# Patient Record
Sex: Male | Born: 1975 | Race: White | Hispanic: No | Marital: Married | Smoking: Former smoker
Health system: Southern US, Community
[De-identification: ages and names within clinical notes are randomized; demographics above are authoritative.]

## PROBLEM LIST (undated history)

## (undated) DIAGNOSIS — E7800 Pure hypercholesterolemia, unspecified: Secondary | ICD-10-CM

## (undated) DIAGNOSIS — R400 Somnolence: Secondary | ICD-10-CM

## (undated) DIAGNOSIS — F419 Anxiety disorder, unspecified: Secondary | ICD-10-CM

## (undated) HISTORY — PX: HERNIA REPAIR: SHX51

## (undated) HISTORY — PX: MYRINGOTOMY WITH TUBE PLACEMENT: SHX5663

## (undated) HISTORY — DX: Somnolence: R40.0

## (undated) HISTORY — DX: Pure hypercholesterolemia, unspecified: E78.00

---

## 2016-05-15 ENCOUNTER — Encounter: Payer: Self-pay | Admitting: Pulmonary Disease

## 2016-05-16 ENCOUNTER — Encounter: Payer: Self-pay | Admitting: Pulmonary Disease

## 2016-05-16 ENCOUNTER — Ambulatory Visit (INDEPENDENT_AMBULATORY_CARE_PROVIDER_SITE_OTHER): Payer: BLUE CROSS/BLUE SHIELD | Admitting: Pulmonary Disease

## 2016-05-16 VITALS — BP 104/72 | HR 75 | Ht 59.0 in | Wt 134.0 lb

## 2016-05-16 DIAGNOSIS — R0609 Other forms of dyspnea: Secondary | ICD-10-CM

## 2016-05-16 DIAGNOSIS — G4710 Hypersomnia, unspecified: Secondary | ICD-10-CM | POA: Diagnosis not present

## 2016-05-16 DIAGNOSIS — R0602 Shortness of breath: Secondary | ICD-10-CM

## 2016-05-16 NOTE — Progress Notes (Signed)
Subjective:    Patient ID: Daniel Cortez, male    DOB: 08/10/1975, 41 y.o.   MRN: [id number]  HPI   This is the case of Daniel Cortez, 41 y.o. Male, who was referred by Reinaldo Nodal  in consultation regarding possible OSA. .   As you very well know, patient has a 5PY smoking history, quit in his 44s, not known to have asthma or copd.   Patient has snoring, witnessed apneas, gasping, choking, awakenings at night, unrefreshed sleep. Sleeps 7 hrs when he has to work, and 12 hrs when off.  Very sleepy. Naps daily. (-) abnormal behavior in sleep.   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 He drives 12 miles 1 way to work. Gets sleepy driving.   He is SOB x 1 year.  Gets SOB with ADLs. (-) cp. He had "bronchitis" end of jan and feb.  He is finishing ZPAK. (-) orthopnea/swelling.   ESS 17.    Review of Systems  Constitutional: Negative.  Negative for fever and unexpected weight change.  HENT: Negative.  Negative for congestion, dental problem, ear pain, nosebleeds, postnasal drip, rhinorrhea, sinus pressure, sneezing, sore throat and trouble swallowing.   Eyes: Negative.  Negative for redness and itching.  Respiratory: Positive for cough, shortness of breath and wheezing. Negative for chest tightness.   Cardiovascular: Negative.  Negative for palpitations and leg swelling.  Gastrointestinal: Negative for nausea and vomiting.  Endocrine: Negative.   Genitourinary: Negative.  Negative for dysuria.  Musculoskeletal: Negative.  Negative for joint swelling.  Skin: Negative.  Negative for rash.  Allergic/Immunologic: Negative.  Negative for environmental allergies, food allergies and immunocompromised state.  Neurological: Positive for headaches.  Hematological: Negative.  Does not bruise/bleed easily.  Psychiatric/Behavioral: Negative.  Negative for dysphoric mood. The patient is not nervous/anxious.    Past Medical History:  Diagnosis Date  . Daytime sleepiness   .  Hypercholesteremia    (-) CA, DVT  Family History  Problem Relation Age of Onset  . Diabetes Father   . Seizures Father   . Colon cancer Maternal Grandmother      Past Surgical History:  Procedure Laterality Date  . HERNIA REPAIR    . MYRINGOTOMY WITH TUBE PLACEMENT      Social History   Social History  . Marital status: Married    Spouse name: N/A  . Number of children: N/A  . Years of education: N/A   Occupational History  . Not on file.   Social History Main Topics  . Smoking status: Former Smoker    Types: Cigarettes  . Smokeless tobacco: Current User  . Alcohol use Not on file  . Drug use: Unknown  . Sexual activity: Not on file   Other Topics Concern  . Not on file   Social History Narrative  . No narrative on file   Married with 1 child.  Lives in [place].   Allergies  Allergen Reactions  . Caffeine Anxiety    unknown     No outpatient prescriptions prior to visit.   No facility-administered medications prior to visit.    Meds ordered this encounter  Medications  . azithromycin (ZITHROMAX) 250 MG tablet    Sig: TAKE 2 TABLETS BY MOUTH X 1 TODAY, THEN 1 TABLET BY MOUTH FOR 4 DAYS        Objective:   Physical Exam  Vitals:  Vitals:   05/16/16 1400  BP: 104/72  Pulse: 75  SpO2: 98%  Weight: 134 lb (60.8 kg)  Height: 4\' 11"  (1.499 m)    Constitutional/General:  Pleasant, well-nourished, well-developed, not in any distress,  Comfortably seating.  Well kempt  Body mass index is 27.06 kg/m. Wt Readings from Last 3 Encounters:  05/16/16 134 lb (60.8 kg)     HEENT: Pupils equal and reactive to light and accommodation. Anicteric sclerae. Normal nasal mucosa.   No oral  lesions,  mouth clear,  oropharynx clear, no postnasal drip. (-) Oral thrush. No dental caries.  Airway - Mallampati class IV.  Crowded airway.   Neck: No masses. Midline trachea. No JVD, (-) LAD. (-) bruits appreciated.  Respiratory/Chest: Grossly normal chest.  (-) deformity. (-) Accessory muscle use.  Symmetric expansion. (-) Tenderness on palpation.  Resonant on percussion.  Diminished BS on both lower lung zones. (-) wheezing, crackles, rhonchi (-) egophony  Cardiovascular: Regular rate and  rhythm, heart sounds normal, no murmur or gallops, no peripheral edema  Gastrointestinal:  Normal bowel sounds. Soft, non-tender. No hepatosplenomegaly.  (-) masses.   Musculoskeletal:  Normal muscle tone. Normal gait.   Extremities: Grossly normal. (-) clubbing, cyanosis.  (-) edema  Skin: (-) rash,lesions seen.   Neurological/Psychiatric : alert, oriented to time, place, person. Normal mood and affect         Assessment & Plan:  Hypersomnia Patient has snoring, witnessed apneas, gasping, choking, awakenings at night, unrefreshed sleep. Sleeps 7 hrs when he has to work, and 12 hrs when off.  Very sleepy. Naps daily. (-) abnormal behavior in sleep.   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 He drives 12 miles 1 way to work. Gets sleepy driving.   He is SOB x 1 year.  Gets SOB with ADLs. (-) cp. He had "bronchitis" end of jan and feb.  He is finishing ZPAK. (-) orthopnea/swelling.   ESS 17.    Plan :  We discussed about the diagnosis of Obstructive Sleep Apnea (OSA) and implications of untreated OSA. We discussed about CPAP and BiPaP as possible treatment options.    We will schedule the patient for a sleep study. Plan for a split-night sleep study. Very symptomatic. He gets sleepy driving to work. He does shift work. He can be sleeping all day and wakes up very sleepy. His father-in-law has CPAP. Anticipate no issues with CPAP.   Patient was instructed to call the office if he/she has not heard back from the office 1-2 weeks after the sleep study.   Patient was instructed to call the office if he/she is having issues with the PAP device.   We discussed good sleep hygiene.   Patient was advised not to engage in activities  requiring concentration and/or vigilance if he/she is sleepy.  Patient was advised not to drive if he/she is sleepy.    Exertional dyspnea He is SOB x 1 year.  Gets SOB with ADLs. (-) cp. He had "bronchitis" end of jan and feb.  He is finishing ZPAK. (-) orthopnea/swelling.   Denies failure symptoms. No edema. 5 pack smoking history, quit in his 1520s.  Chest x-ray done in [place] on 05/10/2016: May be some hyperaeration. No infiltrates, no increased interstitial markings.  Not sure what etiology of exertional dyspnea is. Need to R/O COPD and CHF/CAD. Could be restrictive ventilatory defect. If PFT and echo are normal, plan to do a chest CT scan.      Thank you very much for letting me participate in this patient's care. Please do not  hesitate to give me a call if you have any questions or concerns regarding the treatment plan.   Patient will follow up with me in 8-10 weeks.     Pollie Meyer, MD 05/16/2016   2:43 PM Pulmonary and Critical Care Medicine [facility] Pager: [phone] Office: [phone], Fax: [phone]

## 2016-05-16 NOTE — Assessment & Plan Note (Addendum)
Patient has snoring, witnessed apneas, gasping, choking, awakenings at night, unrefreshed sleep. Sleeps 7 hrs when he has to work, and 12 hrs when off.  Very sleepy. Naps daily. (-) abnormal behavior in sleep.   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 He drives 12 miles 1 [address] to work. Gets sleepy driving.   He is SOB x 1 year.  Gets SOB with ADLs. (-) cp. He had "bronchitis" end of jan and feb.  He is finishing ZPAK. (-) orthopnea/swelling.   ESS 17.    Plan :  We discussed about the diagnosis of Obstructive Sleep Apnea (OSA) and implications of untreated OSA. We discussed about CPAP and BiPaP as possible treatment options.    We will schedule the patient for a sleep study. Plan for a split-night sleep study. Very symptomatic. He gets sleepy driving to work. He does shift work. He can be sleeping all day and wakes up very sleepy. His father-in-law has CPAP. Anticipate no issues with CPAP.   Patient was instructed to call the office if he/she has not heard back from the office 1-2 weeks after the sleep study.   Patient was instructed to call the office if he/she is having issues with the PAP device.   We discussed good sleep hygiene.   Patient was advised not to engage in activities requiring concentration and/or vigilance if he/she is sleepy.  Patient was advised not to drive if he/she is sleepy.

## 2016-05-16 NOTE — Assessment & Plan Note (Signed)
He is SOB x 1 year.  Gets SOB with ADLs. (-) cp. He had "bronchitis" end of jan and feb.  He is finishing ZPAK. (-) orthopnea/swelling.   Denies failure symptoms. No edema. 5 pack smoking history, quit in his 6720s.  Chest x-ray done in [place] on 05/10/2016: May be some hyperaeration. No infiltrates, no increased interstitial markings.  Not sure what etiology of exertional dyspnea is. Need to R/O COPD and CHF/CAD. Could be restrictive ventilatory defect. If PFT and echo are normal, plan to do a chest CT scan.

## 2016-05-16 NOTE — Patient Instructions (Signed)
It was a pleasure taking care of you today!  We will schedule you to have a sleep study to determine if you have sleep apnea.    We will get a lab sleep study.  You will be scheduled to have a lab sleep study in 4-6 weeks.  Someone from the sleep lab will call you in 2-3 days to schedule the study with you.  They usually have cancellations every night so most likely, they will have openings for a lab sleep study next week or so.  We encourage you to do your sleep study then if possible. Please give us a call in a week is no one from the sleep lab calls you in 2-3 days.   If the sleep study is positive, we will order you a CPAP  machine.  Please call the office if you do NOT receive your machine in the next 1-2 weeks.   Please make sure you use your CPAP device everytime you sleep.  We will monitor the usage of your machine per your insurance requirement.  Your insurance company may take the machine from you if you are not using it regularly.   Please clean the mask, tubings, filter, water reservoir with soapy water every week.  Please use distilled water for the water reservoir.   Please call the office or your machine provider (DME company) if you are having issues with the device.   We'll get a breathing test as well as an ultrasound of your heart.   Return to clinic in 8-10 weeks with Dr. Christene Slatese Dios or NP

## 2016-05-30 ENCOUNTER — Ambulatory Visit (HOSPITAL_COMMUNITY): Payer: BLUE CROSS/BLUE SHIELD | Attending: Cardiology

## 2016-05-30 ENCOUNTER — Other Ambulatory Visit: Payer: Self-pay

## 2016-05-30 DIAGNOSIS — R0602 Shortness of breath: Secondary | ICD-10-CM | POA: Diagnosis not present

## 2016-06-02 ENCOUNTER — Telehealth: Payer: Self-pay | Admitting: Pulmonary Disease

## 2016-06-02 NOTE — Telephone Encounter (Signed)
Spoke with the spouse and notified of results of ECHO  She verbalized understanding and nothing further needed

## 2016-06-02 NOTE — Progress Notes (Signed)
Spoke with the pt's spouse and notified of results

## 2016-06-27 ENCOUNTER — Ambulatory Visit (HOSPITAL_BASED_OUTPATIENT_CLINIC_OR_DEPARTMENT_OTHER): Payer: BLUE CROSS/BLUE SHIELD

## 2016-06-27 ENCOUNTER — Encounter (HOSPITAL_COMMUNITY): Payer: Self-pay | Admitting: Emergency Medicine

## 2016-06-27 ENCOUNTER — Emergency Department (HOSPITAL_COMMUNITY)
Admission: EM | Admit: 2016-06-27 | Discharge: 2016-06-27 | Disposition: A | Discharge status: Home / Self Care | Payer: BLUE CROSS/BLUE SHIELD | Attending: Emergency Medicine | Admitting: Emergency Medicine

## 2016-06-27 ENCOUNTER — Emergency Department (HOSPITAL_COMMUNITY): Payer: BLUE CROSS/BLUE SHIELD

## 2016-06-27 ENCOUNTER — Ambulatory Visit (INDEPENDENT_AMBULATORY_CARE_PROVIDER_SITE_OTHER): Payer: BLUE CROSS/BLUE SHIELD | Admitting: Pulmonary Disease

## 2016-06-27 ENCOUNTER — Telehealth: Payer: Self-pay | Admitting: Pulmonary Disease

## 2016-06-27 DIAGNOSIS — R072 Precordial pain: Secondary | ICD-10-CM | POA: Insufficient documentation

## 2016-06-27 DIAGNOSIS — R0602 Shortness of breath: Secondary | ICD-10-CM

## 2016-06-27 DIAGNOSIS — R0789 Other chest pain: Secondary | ICD-10-CM | POA: Diagnosis present

## 2016-06-27 DIAGNOSIS — Z87891 Personal history of nicotine dependence: Secondary | ICD-10-CM | POA: Diagnosis not present

## 2016-06-27 HISTORY — DX: Anxiety disorder, unspecified: F41.9

## 2016-06-27 LAB — CBC
HCT: 43.5 % (ref 39.0–52.0)
Hemoglobin: 15.7 g/dL (ref 13.0–17.0)
MCH: 32.1 pg (ref 26.0–34.0)
MCHC: 36.1 g/dL — ABNORMAL HIGH (ref 30.0–36.0)
MCV: 89 fL (ref 78.0–100.0)
PLATELETS: 253 10*3/uL (ref 150–400)
RBC: 4.89 MIL/uL (ref 4.22–5.81)
RDW: 13 % (ref 11.5–15.5)
WBC: 7.9 10*3/uL (ref 4.0–10.5)

## 2016-06-27 LAB — BASIC METABOLIC PANEL
ANION GAP: 7 (ref 5–15)
BUN: 18 mg/dL (ref 6–20)
CHLORIDE: 104 mmol/L (ref 101–111)
CO2: 27 mmol/L (ref 22–32)
CREATININE: 0.96 mg/dL (ref 0.61–1.24)
Calcium: 9.4 mg/dL (ref 8.9–10.3)
GFR calc non Af Amer: >60 mL/min (ref >60)
Glucose, Bld: 98 mg/dL (ref 65–99)
Potassium: 4 mmol/L (ref 3.5–5.1)
SODIUM: 138 mmol/L (ref 135–145)

## 2016-06-27 LAB — PULMONARY FUNCTION TEST
FEF 25-75 Pre: 1.28 L/sec
FEF2575-%Pred-Pre: 41 %
FEV1-%Pred-Pre: 69 %
FEV1-Pre: 2.07 L
FEV1FVC-%PRED-PRE: 80 %
FEV6-%PRED-PRE: 89 %
FEV6-Pre: 3.19 L
FEV6FVC-%PRED-PRE: 101 %
FVC-%PRED-PRE: 87 %
FVC-PRE: 3.21 L
PRE FEV1/FVC RATIO: 65 %
PRE FEV6/FVC RATIO: 99 %

## 2016-06-27 LAB — I-STAT TROPONIN, ED
TROPONIN I, POC: 0 ng/mL (ref 0.00–0.08)
TROPONIN I, POC: 0 ng/mL (ref 0.00–0.08)

## 2016-06-27 LAB — D-DIMER, QUANTITATIVE: D-Dimer, Quant: 0.38 ug/mL-FEU (ref 0.00–0.50)

## 2016-06-27 MED ORDER — IBUPROFEN 800 MG PO TABS: 800.0000 mg | ORAL_TABLET | Freq: Three times a day (TID) | ORAL | 0 refills | Status: AC | PRN

## 2016-06-27 MED ORDER — IBUPROFEN 100 MG/5ML PO SUSP
600.0000 mg | Freq: Once | ORAL | Status: AC
  Administered 2016-06-27: 600 mg via ORAL
  Filled 2016-06-27: qty 30

## 2016-06-27 MED ORDER — IBUPROFEN 800 MG PO TABS: 800.0000 mg | ORAL_TABLET | Freq: Once | ORAL | Status: DC

## 2016-06-27 NOTE — Discharge Instructions (Signed)

## 2016-06-27 NOTE — ED Provider Notes (Signed)
Emergency Department Provider Note   I have reviewed the triage vital signs and the nursing notes.   HISTORY  Chief Complaint Chest Pain   HPI Kwan Shellhammer is a 41 y.o. male with PMH of HLD, anxiety, and DM presents to the ED for evaluation of left sided chest and shoulder pain for the last 2 weeks. He reports some associated anterior chest pain. No modifying factors. No fever or chills. No similar symptoms in the past. Describes the pain as sharp. He was scheduled for a sleep study this evening what was delayed 2/2 chest pain. Patient was referred to the ED. Pain radiates down the left arm.   Past Medical History:  Diagnosis Date  . Anxiety   . Daytime sleepiness   . Hypercholesteremia     Patient Active Problem List   Diagnosis Date Noted  . Hypersomnia 05/16/2016  . Exertional dyspnea 05/16/2016    Past Surgical History:  Procedure Laterality Date  . HERNIA REPAIR    . MYRINGOTOMY WITH TUBE PLACEMENT      Current Outpatient Rx  . Order #: [id number] Class: Historical Med  . Order #: [id number] Class: Print    Allergies Caffeine  Family History  Problem Relation Age of Onset  . Diabetes Father   . Seizures Father   . Colon cancer Maternal Grandmother     Social History Social History  Substance Use Topics  . Smoking status: Former Smoker    Types: Cigarettes  . Smokeless tobacco: Current User  . Alcohol use Not on file    Review of Systems  Constitutional: No fever/chills Eyes: No visual changes. ENT: No sore throat. Cardiovascular: Positive chest pain. Respiratory: Denies shortness of breath. Gastrointestinal: No abdominal pain.  No nausea, no vomiting.  No diarrhea.  No constipation. Genitourinary: Negative for dysuria. Musculoskeletal: Negative for back pain. Positive left shoulder and left arm pain.  Skin: Negative for rash. Neurological: Negative for headaches, focal weakness or numbness.  10-point ROS otherwise  negative.  ____________________________________________   PHYSICAL EXAM:  VITAL SIGNS: ED Triage Vitals  Enc Vitals Group     BP 06/27/16 1652 128/80     Pulse Rate 06/27/16 1652 72     Resp 06/27/16 1652 18     Temp 06/27/16 1652 98.1 F (36.7 C)     Temp Source 06/27/16 1652 Oral     SpO2 06/27/16 1652 98 %     Pain Score 06/27/16 1657 7   Constitutional: Alert and oriented. Well appearing and in no acute distress. Eyes: Conjunctivae are normal.  Head: Atraumatic. Nose: No congestion/rhinnorhea. Mouth/Throat: Mucous membranes are moist.  Oropharynx non-erythematous. Neck: No stridor. Cardiovascular: Normal rate, regular rhythm. Good peripheral circulation. Grossly normal heart sounds.   Respiratory: Normal respiratory effort.  No retractions. Lungs CTAB. Gastrointestinal: Soft and nontender. No distention.  Musculoskeletal: No lower extremity tenderness nor edema. No gross deformities of extremities. Neurologic:  Normal speech and language. No gross focal neurologic deficits are appreciated.  Skin:  Skin is warm, dry and intact. No rash noted.   ____________________________________________   LABS (all labs ordered are listed, but only abnormal results are displayed)  Labs Reviewed  CBC - Abnormal; Notable for the following:       Result Value   MCHC 36.1 (*)    All other components within normal limits  BASIC METABOLIC PANEL  D-DIMER, QUANTITATIVE (NOT AT [facility])  I-STAT TROPOININ, ED  I-STAT TROPOININ, ED   ____________________________________________  EKG   EKG Interpretation  Date/Time:  Friday June 27 2016 16:56:07 EDT Ventricular Rate:  78 PR Interval:    QRS Duration: 100 QT Interval:  357 QTC Calculation: 407 R Axis:   62 Text Interpretation:  Sinus rhythm Short PR interval RSR' in V1 or V2, right VCD or RVH No STEMI.  Confirmed by Tashanti Dalporto MD, Kyrus Hyde [phone]) on 06/27/2016 8:56:29 PM Also confirmed by Santiaga Butzin MD, Letina Luckett [phone]), editor Misty Stanley [phone])  on 06/28/2016 9:32:10 AM       ____________________________________________  RADIOLOGY  Dg Chest 2 View  Result Date: 06/27/2016 CLINICAL DATA:  Left upper quadrant pain.  Posterior scapular pain. EXAM: CHEST  2 VIEW COMPARISON:  None. FINDINGS: The heart size and mediastinal contours are within normal limits. Both lungs are clear. The visualized skeletal structures are unremarkable. IMPRESSION: No active cardiopulmonary disease. Electronically Signed   By: Elige Ko   On: 06/27/2016 17:17    ____________________________________________   PROCEDURES  Procedure(s) performed:   Procedures  None ____________________________________________   INITIAL IMPRESSION / ASSESSMENT AND PLAN / ED COURSE  Pertinent labs & imaging results that were available during my care of the patient were reviewed by me and considered in my medical decision making (see chart for details).  Patient presents to the ED with atypical chest pain. Serial troponin negative. EKG unremarkable. D-dimer negative. No further imaging. Plan for discharge with PCP follow up.   At this time, I do not feel there is any life-threatening condition present. I have reviewed and discussed all results (EKG, imaging, lab, urine as appropriate), exam findings with patient. I have reviewed nursing notes and appropriate previous records.  I feel the patient is safe to be discharged home without further emergent workup. Discussed usual and customary return precautions. Patient and family (if present) verbalize understanding and are comfortable with this plan.  Patient will follow-up with their primary care provider. If they do not have a primary care provider, information for follow-up has been provided to them. All questions have been answered.  ____________________________________________  FINAL CLINICAL IMPRESSION(S) / ED DIAGNOSES  Final diagnoses:  Precordial chest pain     MEDICATIONS GIVEN DURING THIS  VISIT:  Medications  ibuprofen (ADVIL,MOTRIN) 100 MG/5ML suspension 600 mg (600 mg Oral Given 06/27/16 2127)     NEW OUTPATIENT MEDICATIONS STARTED DURING THIS VISIT:  New Prescriptions   IBUPROFEN (ADVIL,MOTRIN) 800 MG TABLET    Take 1 tablet (800 mg total) by mouth every 8 (eight) hours as needed.      Note:  This document was prepared using Dragon voice recognition software and may include unintentional dictation errors.  Alona Bene, MD Emergency Medicine   Maia Plan, MD 06/30/16 [phone]

## 2016-06-27 NOTE — ED Triage Notes (Signed)
Pt complaint of intermittent central/left chest pressure/stabbing since Wednesday. Denies associated symptoms.

## 2016-06-27 NOTE — Telephone Encounter (Addendum)
Per Jerolyn Shin, PFT tech, pt started having left sided chest pain that radiate to left arm during PFT. PFT was stopped. Pt states he rated his pain at a 7/10 and has been worsening since initial presentation on 06/25/16. Pt states he had a sleep study tonight and will need to this cancelled, this has been done. Pt states he wants to go to ED to be evaluated. Pt aware to call sleep lab back to get sleep study.   Will forward to AD as FYI.

## 2016-07-31 ENCOUNTER — Ambulatory Visit: Payer: BLUE CROSS/BLUE SHIELD | Admitting: Pulmonary Disease

## 2016-08-08 ENCOUNTER — Ambulatory Visit (HOSPITAL_BASED_OUTPATIENT_CLINIC_OR_DEPARTMENT_OTHER): Payer: BLUE CROSS/BLUE SHIELD | Attending: Pulmonary Disease | Admitting: Pulmonary Disease

## 2016-08-08 DIAGNOSIS — G4710 Hypersomnia, unspecified: Secondary | ICD-10-CM | POA: Insufficient documentation

## 2016-08-12 ENCOUNTER — Telehealth: Payer: Self-pay | Admitting: Pulmonary Disease

## 2016-08-12 ENCOUNTER — Other Ambulatory Visit (HOSPITAL_BASED_OUTPATIENT_CLINIC_OR_DEPARTMENT_OTHER): Payer: Self-pay

## 2016-08-12 DIAGNOSIS — G4710 Hypersomnia, unspecified: Secondary | ICD-10-CM

## 2016-08-12 NOTE — Procedures (Signed)
    NAME: Daniel GougeMichael Boehne DATE OF BIRTH:  01/03/1976 MEDICAL RECORD NUMBER [id number]  LOCATION: [facility]  PHYSICIAN: Daneen SchickJose Angelo A De Dios  DATE OF STUDY: 08/08/2016  CLINICAL INFORMATION  Sleep Study Type: NPSG  Indication for sleep study: Snoring  Epworth Sleepiness Score: 20   SLEEP STUDY TECHNIQUE  As per the AASM Manual for the Scoring of Sleep and Associated Events v2.3 (April 2016) with a hypopnea requiring 4% desaturations.  The channels recorded and monitored were frontal, central and occipital EEG, electrooculogram (EOG), submentalis EMG (chin), nasal and oral airflow, thoracic and abdominal wall motion, anterior tibialis EMG, snore microphone, electrocardiogram, and pulse oximetry.   MEDICATIONS  Medications self-administered by patient taken the night of the study : N/A   SLEEP ARCHITECTURE  The study was initiated at 9:51:35 PM and ended at 4:23:48 AM.  Sleep onset time was 2.9 minutes and the sleep efficiency was 84.3%. The total sleep time was 330.8 minutes.  Stage REM latency was 136.5 minutes.  The patient spent 3.11% of the night in stage N1 sleep, 72.70% in stage N2 sleep, 0.00% in stage N3 and 24.18% in REM.  Alpha intrusion was absent.  Supine sleep was 15.33%.   RESPIRATORY PARAMETERS  The overall apnea/hypopnea index (AHI) was 0.4 per hour. There were 2 total apneas, including 0 obstructive, 2 central and 0 mixed apneas. There were 0 hypopneas and 2 RERAs.  The AHI during Stage REM sleep was 1.5 per hour. AHI while supine was 0.0 per hour.  The mean oxygen saturation was 94.82%. The minimum SpO2 during sleep was 91.00%.  Moderate snoring was noted during this study.  CARDIAC DATA  The 2 lead EKG demonstrated sinus rhythm. The mean heart rate was 65.30 beats per minute. Other EKG findings include: None.  LEG MOVEMENT DATA  The total PLMS were 2 with a resulting PLMS index of 0.36. Associated arousal with leg movement index was 0.0  .  IMPRESSIONS  1. No significant obstructive sleep apnea occurred during this study (AHI = 0.4/h). 2. No significant central sleep apnea occurred during this study (CAI = 0.4/h). 3. Severe oxygen desaturation was noted during this study (Min O2 = 91.00%). 4. The patient snored with Moderate snoring volume. 5. No cardiac abnormalities were noted during this study. 6. Clinically significant periodic limb movements did not occur during sleep. No significant associated arousals.  DIAGNOSIS  No evidence for significant OSA based on this study.   RECOMMENDATIONS  1. No evidence for significant OSA based on this study. Clinical correlation regarding hypersomnia recommended. 2. Avoid alcohol, sedatives and other CNS depressants that may worsen sleep apnea and disrupt normal sleep architecture 3. Sleep hygiene should be reviewed to assess factors that may improve sleep quality. 4. Weight management and regular exercise should be initiated or continued if appropriate. 5. Follow up in the office as scheduled.  Pollie MeyerJ. Angelo A de Dios, MD 08/12/2016, 5:22 PM [place] [facility] Pager [phone] After 3 pm or if no answer, call [phone]

## 2016-08-12 NOTE — Telephone Encounter (Signed)
    Please call the pt and tell the pt the LAB SLEEP STUDY   did not show OSA.   Pt stops breathing < 1   times an hour.   We can discuss results on f/u. thanks   J. Alexis FrockAngelo A de Dios, MD 08/12/2016, 5:23 PM

## 2016-08-13 NOTE — Telephone Encounter (Signed)
[id number] x1 

## 2016-08-14 NOTE — Telephone Encounter (Signed)
Spoke with pt's wife (dpr on file), aware of results.  Verified ROV date/time.  Nothing further needed at this time.

## 2016-08-14 NOTE — Telephone Encounter (Signed)
Pt wife returning call a/b results she can be reached @ [phone].Caren GriffinsStanley A Dalton

## 2016-08-29 ENCOUNTER — Ambulatory Visit: Payer: BLUE CROSS/BLUE SHIELD | Admitting: Adult Health

## 2016-09-08 NOTE — Progress Notes (Signed)
Pt unable to complete test-sent to ER for CP.

## 2016-09-08 NOTE — Addendum Note (Signed)
Addended by: Ronny BaconWELCHEL, KATIE C on: 09/08/2016 02:53 PM   Modules accepted: Level of Service

## 2016-09-25 ENCOUNTER — Other Ambulatory Visit: Payer: Self-pay | Admitting: Internal Medicine

## 2016-09-25 DIAGNOSIS — R0600 Dyspnea, unspecified: Secondary | ICD-10-CM

## 2016-09-26 ENCOUNTER — Ambulatory Visit (INDEPENDENT_AMBULATORY_CARE_PROVIDER_SITE_OTHER): Payer: BLUE CROSS/BLUE SHIELD | Admitting: Adult Health

## 2016-09-26 ENCOUNTER — Ambulatory Visit (INDEPENDENT_AMBULATORY_CARE_PROVIDER_SITE_OTHER): Payer: BLUE CROSS/BLUE SHIELD | Admitting: Internal Medicine

## 2016-09-26 ENCOUNTER — Encounter: Payer: Self-pay | Admitting: Adult Health

## 2016-09-26 DIAGNOSIS — G4710 Hypersomnia, unspecified: Secondary | ICD-10-CM

## 2016-09-26 DIAGNOSIS — J4530 Mild persistent asthma, uncomplicated: Secondary | ICD-10-CM

## 2016-09-26 DIAGNOSIS — R0600 Dyspnea, unspecified: Secondary | ICD-10-CM | POA: Diagnosis not present

## 2016-09-26 LAB — PULMONARY FUNCTION TEST
DL/VA % pred: 107 %
DL/VA: 4.08 ml/min/mmHg/L
DLCO UNC % PRED: 115 %
DLCO UNC: 21 ml/min/mmHg
DLCO cor % pred: 110 %
DLCO cor: 20.19 ml/min/mmHg
FEF 25-75 PRE: 1.31 L/s
FEF 25-75 Post: 1.69 L/sec
FEF2575-%Change-Post: 29 %
FEF2575-%PRED-POST: 55 %
FEF2575-%Pred-Pre: 42 %
FEV1-%CHANGE-POST: 10 %
FEV1-%PRED-POST: 83 %
FEV1-%PRED-PRE: 75 %
FEV1-POST: 2.48 L
FEV1-PRE: 2.24 L
FEV1FVC-%Change-Post: 7 %
FEV1FVC-%PRED-PRE: 79 %
FEV6-%CHANGE-POST: 4 %
FEV6-%PRED-POST: 101 %
FEV6-%Pred-Pre: 97 %
FEV6-POST: 3.63 L
FEV6-PRE: 3.48 L
FEV6FVC-%CHANGE-POST: 1 %
FEV6FVC-%PRED-POST: 102 %
FEV6FVC-%PRED-PRE: 100 %
FVC-%CHANGE-POST: 2 %
FVC-%Pred-Post: 99 %
FVC-%Pred-Pre: 96 %
FVC-Post: 3.63 L
FVC-Pre: 3.54 L
POST FEV1/FVC RATIO: 68 %
Post FEV6/FVC ratio: 100 %
Pre FEV1/FVC ratio: 63 %
Pre FEV6/FVC Ratio: 98 %
RV % pred: 144 %
RV: 1.82 L
TLC % PRED: 111 %
TLC: 5.34 L

## 2016-09-26 LAB — NITRIC OXIDE: NITRIC OXIDE: 12

## 2016-09-26 NOTE — Progress Notes (Signed)
I have reviewed and agree with assessment/plan.  Dammon Makarewicz, MD [place] [facility] 09/26/2016, 3:18 PM Pager:  [phone]  

## 2016-09-26 NOTE — Progress Notes (Signed)
PFT done today. 

## 2016-09-26 NOTE — Progress Notes (Signed)
 @Patient  ID: Daniel Cortez, male    DOB: May 20, 1975, 41 y.o.   MRN: [id number]  Chief Complaint  Patient presents with  . Follow-up    asthma     Referring provider: [facility] [place] [facility]  HPI: 41 year old male with minimal smoking history presented for consult in March 2018 for possible sleep apnea and shortness of breath.  TEST  NSPG 07/2016 neg for OSA   09/26/2016 Follow up : Datytime sleepiness and Cough /dyspnea  Patient returns for a four-month follow-up. Patient was seen in March of this year for a consult for possible sleep apnea and dyspnea with cough. Patient was set up for a sleep study that was done on May 25,2018 did not show any evidence of sleep apnea with AHI at 0.4. Patient been having daytime sleepiness and snoring.  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 He also works a swing shift from 3 AM to 1 PM, 5-6 days a week. Since then he has been sleepy. Has troubles with daytime sleepiness.  He is hopeful that he may have a new job, but has not gotten final confirmation yet. Patient was set up for pulmonary function test that was done today. That showed mild airflow obstruction with an FEV1 at 83%, ratio 68, FVC 99%, 10% bronchodilator change. Positive mid flow reversibility. DLCO 115%.  Chest x-ray in April showed clear lungs.. Exhaled nitric oxide testing today was nml at 12 .  He denies any hemoptysis, chest pain, orthopnea, PND, or increased leg swelling     Allergies  Allergen Reactions  . Caffeine Anxiety    unknown    Immunization History  Administered Date(s) Administered  . Influenza,inj,Quad PF,36+ Mos 02/20/2016    Past Medical History:  Diagnosis Date  . Anxiety   . Daytime sleepiness   . Hypercholesteremia     Tobacco History: History  Smoking Status  . Former Smoker    . Types: Cigarettes  . Quit date: 03/17/1996  Smokeless Tobacco  . Current User   Ready to quit: Not Answered Counseling given: Not Answered   Outpatient Encounter Prescriptions as of 09/26/2016  Medication Sig  . acetaminophen (TYLENOL CHILDRENS) 160 MG/5ML suspension Take 160 mg by mouth every 6 (six) hours as needed for moderate pain.  Marland Kitchen. albuterol (PROVENTIL) (2.5 MG/3ML) 0.083% nebulizer solution Take 2.5 mg by nebulization every 6 (six) hours as needed for wheezing or shortness of breath.  Marland Kitchen. ibuprofen (ADVIL,MOTRIN) 800 MG tablet Take 1 tablet (800 mg total) by mouth every 8 (eight) hours as needed.   No facility-administered encounter medications on file as of 09/26/2016.      Review of Systems  Constitutional:   No  weight loss, night sweats,  Fevers, chills, fatigue, or  lassitude.  HEENT:   No headaches,  Difficulty swallowing,  Tooth/dental problems, or  Sore throat,                No sneezing, itching, ear ache, +nasal congestion, post nasal drip,   CV:  No chest pain,  Orthopnea, PND, swelling in lower extremities, anasarca, dizziness, palpitations, syncope.   GI  No heartburn, indigestion, abdominal pain, nausea, vomiting, diarrhea, change in bowel habits, loss of appetite, bloody stools.   Resp:   No chest wall deformity  Skin: no rash or lesions.  GU: no dysuria, change in  color of urine, no urgency or frequency.  No flank pain, no hematuria   MS:  No joint pain or swelling.  No decreased range of motion.  No back pain.    Physical Exam  BP 112/64 (BP Location: Right Arm, Cuff Size: Normal)   Pulse 66   Ht 4' 11.5" (1.511 m)   Wt 136 lb (61.7 kg)   SpO2 98%   BMI 27.01 kg/m   GEN: A/Ox3; pleasant , NAD ,    HEENT:  [place]/AT,   l, NOSE-clear, THROAT-clear, no lesions, no postnasal drip or exudate noted.  +hearing aids bilaterally   NECK:  Supple w/ fair ROM; no JVD; normal carotid impulses w/o bruits; no thyromegaly or nodules palpated; no  lymphadenopathy.    RESP  Clear  P & A; w/o, wheezes/ rales/ or rhonchi. no accessory muscle use, no dullness to percussion  CARD:  RRR, no m/r/g, no peripheral edema, pulses intact, no cyanosis or clubbing.  GI:   Soft & nt; nml bowel sounds; no organomegaly or masses detected.   Musco: Warm bil, no deformities or joint swelling noted.   Neuro: alert, no focal deficits noted.    Skin: Warm, no lesions or rashes     Lab Results:  CBC    Component Value Date/Time   WBC 7.9 06/27/2016 1703   RBC 4.89 06/27/2016 1703   HGB 15.7 06/27/2016 1703   HCT 43.5 06/27/2016 1703   PLT 253 06/27/2016 1703   MCV 89.0 06/27/2016 1703   MCH 32.1 06/27/2016 1703   MCHC 36.1 (H) 06/27/2016 1703   RDW 13.0 06/27/2016 1703    BMET    Component Value Date/Time   NA 138 06/27/2016 1703   K 4.0 06/27/2016 1703   CL 104 06/27/2016 1703   CO2 27 06/27/2016 1703   GLUCOSE 98 06/27/2016 1703   BUN 18 06/27/2016 1703   CREATININE 0.96 06/27/2016 1703   CALCIUM 9.4 06/27/2016 1703   GFRNONAA >60 06/27/2016 1703   GFRAA >60 06/27/2016 1703    BNP No results found for: BNP  ProBNP No results found for: PROBNP  Imaging: No results found.   Assessment & Plan:   Asthma Suspect this is asthma . Foundry dust may be a trigger . CXR is clear at this point  Would be better if he decreased trigger exposure .   Plan  BREO trial  Patient Instructions  Begin BREO 1 puff daily, rinse after use.  Healthy sleep regimen  Follow up with Dr. Craige Cotta  In 3 months and As needed        Please contact office for sooner follow up if symptoms do not improve or worsen or seek emergency care    Hypersomnia Suspect may be related to his work schedule with long shift and 50-60 hrs each week and swing shift like [employer] (3a to 1pm )  Sleep study did not support sleep apnea.  If persists after he changes shift may need to do additional testing  Says he did not have this problem until he started  this shift.      Rubye Oaks, NP 09/26/2016

## 2016-09-26 NOTE — Assessment & Plan Note (Signed)
Suspect this is asthma . Foundry dust may be a trigger . CXR is clear at this point  Would be better if he decreased trigger exposure .   Plan  BREO trial  Patient Instructions  Begin BREO 1 puff daily, rinse after use.  Healthy sleep regimen  Follow up with Dr. Craige CottaSood  In 3 months and As needed        Please contact office for sooner follow up if symptoms do not improve or worsen or seek emergency care

## 2016-09-26 NOTE — Assessment & Plan Note (Addendum)
Suspect may be related to his work schedule with long shift and 50-60 hrs each week and swing shift like [employer] (3a to 1pm )  Sleep study did not support sleep apnea.  If persists after he changes shift may need to do additional testing  Says he did not have this problem until he started this shift.

## 2016-09-26 NOTE — Patient Instructions (Addendum)
Begin BREO 1 puff daily, rinse after use.  Healthy sleep regimen  Follow up with Dr. Craige CottaSood  In 3 months and As needed

## 2016-09-29 ENCOUNTER — Encounter: Payer: Self-pay | Admitting: Internal Medicine

## 2016-09-29 ENCOUNTER — Telehealth: Payer: Self-pay | Admitting: Adult Health

## 2016-09-29 NOTE — Telephone Encounter (Signed)
Spoke with Energy East CorporationMisty. Advised that per his chart, no one had called the patient to discuss results. I did not see where any imaging or additional lab work had been done. She stated that she will personally listen to the message when she gets home.  She verbalized understanding. Nothing else was needed at time of call.

## 2016-09-29 NOTE — Progress Notes (Signed)
LMTCB

## 2016-10-01 MED ORDER — FLUTICASONE FUROATE-VILANTEROL 100-25 MCG/INH IN AEPB: 1.0000 | INHALATION_SPRAY | Freq: Every day | RESPIRATORY_TRACT | 0 refills | Status: AC

## 2016-10-01 MED ORDER — FLUTICASONE FUROATE-VILANTEROL 100-25 MCG/INH IN AEPB: 1.0000 | INHALATION_SPRAY | Freq: Every day | RESPIRATORY_TRACT | 5 refills | Status: AC

## 2016-10-01 NOTE — Progress Notes (Signed)
ATC, Call will not go through

## 2016-10-01 NOTE — Addendum Note (Signed)
Addended by: Boone MasterJONES, Shivank Pinedo E on: 10/01/2016 12:20 PM   Modules accepted: Orders

## 2016-10-03 NOTE — Progress Notes (Signed)
LMTCB

## 2016-10-06 NOTE — Progress Notes (Signed)
Spoke with pt and notified of results per Dr. Wert. Pt verbalized understanding and denied any questions. 

## 2017-01-01 ENCOUNTER — Telehealth: Payer: Self-pay | Admitting: Pulmonary Disease

## 2017-01-01 NOTE — Telephone Encounter (Signed)
I called patient's wife, Lanice SchwabMisty, to advise of change in VS schedule and to reschedule 10/19 appt (I had left him 3 voicemails this week with no response). Appointment was rescheduled for Friday, 02/20/17 at 4:15pm (patient needed a Friday afternoon due to work).  May close message.

## 2017-01-01 NOTE — Telephone Encounter (Signed)
Left voice mail on machine for patient to return phone call back regarding his appt tomorrow, seeing if he would like to come in earlier that morning instead of the scheduled time of 3:45pm.Preferrable mid afternoon or morning appt.  Will follow up again with patient at later date.

## 2017-01-02 ENCOUNTER — Ambulatory Visit: Payer: BLUE CROSS/BLUE SHIELD | Admitting: Pulmonary Disease

## 2017-02-20 ENCOUNTER — Ambulatory Visit: Payer: Self-pay | Admitting: Pulmonary Disease

## 2017-06-04 ENCOUNTER — Encounter (HOSPITAL_COMMUNITY): Payer: Self-pay

## 2017-06-04 ENCOUNTER — Emergency Department (HOSPITAL_COMMUNITY): Payer: Self-pay

## 2017-06-04 ENCOUNTER — Emergency Department (HOSPITAL_COMMUNITY)
Admission: EM | Admit: 2017-06-04 | Discharge: 2017-06-04 | Disposition: A | Discharge status: Home / Self Care | Payer: Self-pay | Attending: Emergency Medicine | Admitting: Emergency Medicine

## 2017-06-04 ENCOUNTER — Other Ambulatory Visit: Payer: Self-pay

## 2017-06-04 DIAGNOSIS — H00033 Abscess of eyelid right eye, unspecified eyelid: Secondary | ICD-10-CM | POA: Insufficient documentation

## 2017-06-04 DIAGNOSIS — J45909 Unspecified asthma, uncomplicated: Secondary | ICD-10-CM | POA: Insufficient documentation

## 2017-06-04 DIAGNOSIS — Z79899 Other long term (current) drug therapy: Secondary | ICD-10-CM | POA: Insufficient documentation

## 2017-06-04 DIAGNOSIS — Z87891 Personal history of nicotine dependence: Secondary | ICD-10-CM | POA: Insufficient documentation

## 2017-06-04 LAB — BASIC METABOLIC PANEL
Anion gap: 9 (ref 5–15)
BUN: 7 mg/dL (ref 6–20)
CHLORIDE: 103 mmol/L (ref 101–111)
CO2: 26 mmol/L (ref 22–32)
Calcium: 9.5 mg/dL (ref 8.9–10.3)
Creatinine, Ser: 0.92 mg/dL (ref 0.61–1.24)
GFR calc non Af Amer: >60 mL/min (ref >60)
Glucose, Bld: 89 mg/dL (ref 65–99)
POTASSIUM: 3.6 mmol/L (ref 3.5–5.1)
SODIUM: 138 mmol/L (ref 135–145)

## 2017-06-04 LAB — CBC WITH DIFFERENTIAL/PLATELET
BASOS ABS: 0 10*3/uL (ref 0.0–0.1)
Basophils Relative: 0 %
EOS PCT: 3 %
Eosinophils Absolute: 0.2 10*3/uL (ref 0.0–0.7)
HCT: 45.7 % (ref 39.0–52.0)
Hemoglobin: 15.9 g/dL (ref 13.0–17.0)
LYMPHS PCT: 43 %
Lymphs Abs: 3.3 10*3/uL (ref 0.7–4.0)
MCH: 30.9 pg (ref 26.0–34.0)
MCHC: 34.8 g/dL (ref 30.0–36.0)
MCV: 88.9 fL (ref 78.0–100.0)
MONO ABS: 0.6 10*3/uL (ref 0.1–1.0)
Monocytes Relative: 8 %
Neutro Abs: 3.6 10*3/uL (ref 1.7–7.7)
Neutrophils Relative %: 46 %
Platelets: 237 10*3/uL (ref 150–400)
RBC: 5.14 MIL/uL (ref 4.22–5.81)
RDW: 13.1 % (ref 11.5–15.5)
WBC: 7.6 10*3/uL (ref 4.0–10.5)

## 2017-06-04 MED ORDER — ACETAMINOPHEN 325 MG PO TABS: 650.0000 mg | ORAL_TABLET | Freq: Once | ORAL | Status: DC

## 2017-06-04 MED ORDER — IOPAMIDOL (ISOVUE-300) INJECTION 61%
75.0000 mL | Freq: Once | INTRAVENOUS | Status: AC | PRN
  Administered 2017-06-04: 75 mL via INTRAVENOUS

## 2017-06-04 NOTE — ED Triage Notes (Signed)
Pt presents to the ed with complaints of an abscess on his eye, has been on antibiotics x 2 days. Pt states that it is worse since then. He was sent here by an eye doctor. Denies vision loss.

## 2017-06-04 NOTE — ED Provider Notes (Signed)
[facility] EMERGENCY DEPARTMENT Provider Note   CSN: [id number] Arrival date & time: 06/04/17  1442     History   Chief Complaint Chief Complaint  Patient presents with  . Abscess    HPI Daniel Cortez is a 42 y.o. male.  The history is provided by the patient and the spouse.  Abscess  Location:  Head/neck Head/neck abscess location: R lower eyelid. Abscess quality: draining, painful, redness and warmth   Duration:  3 days Progression:  Worsening Pain details:    Quality:  Dull   Severity:  Moderate   Duration:  3 days   Timing:  Constant   Progression:  Worsening Chronicity:  New Context: not diabetes, not immunosuppression and not skin injury   Relieved by:  Nothing Worsened by:  Nothing Ineffective treatments:  Warm compresses and oral antibiotics Associated symptoms: no fever, no headaches, no nausea and no vomiting   -c/o pain looking to right -Told by his eye doctor to come to ED for evaluation and CT scan  Past Medical History:  Diagnosis Date  . Anxiety   . Daytime sleepiness   . Hypercholesteremia     Patient Active Problem List   Diagnosis Date Noted  . Mild persistent asthma without complication 09/26/2016  . Hypersomnia 05/16/2016  . Exertional dyspnea 05/16/2016    Past Surgical History:  Procedure Laterality Date  . HERNIA REPAIR    . MYRINGOTOMY WITH TUBE PLACEMENT         Home Medications    Prior to Admission medications   Medication Sig Start Date End Date Taking? Authorizing Provider  acetaminophen (TYLENOL CHILDRENS) 160 MG/5ML suspension Take 160 mg by mouth every 6 (six) hours as needed for moderate pain.    [provider]  albuterol (PROVENTIL) (2.5 MG/3ML) 0.083% nebulizer solution Take 2.5 mg by nebulization every 6 (six) hours as needed for wheezing or shortness of breath.    [provider]  fluticasone furoate-vilanterol (BREO ELLIPTA) 100-25 MCG/INH AEPB Inhale 1 puff into the  lungs daily. 10/01/16   Parrett, Tammy S, NP  fluticasone furoate-vilanterol (BREO ELLIPTA) 100-25 MCG/INH AEPB Inhale 1 puff into the lungs daily. 10/01/16   Parrett, Virgel Bouquet, NP  ibuprofen (ADVIL,MOTRIN) 800 MG tablet Take 1 tablet (800 mg total) by mouth every 8 (eight) hours as needed. 06/27/16   Long, Arlyss Repress, MD    Family History Family History  Problem Relation Age of Onset  . Diabetes Father   . Seizures Father   . Colon cancer Maternal Grandmother     Social History Social History   Tobacco Use  . Smoking status: Former Smoker    Types: Cigarettes    Last attempt to quit: 03/17/1996    Years since quitting: 21.2  . Smokeless tobacco: Current User  Substance Use Topics  . Alcohol use: Not on file  . Drug use: Not on file     Allergies   Caffeine   Review of Systems Review of Systems  Constitutional: Negative for chills and fever.  HENT: Negative for ear pain and sore throat.   Eyes: Positive for photophobia. Negative for pain, discharge and visual disturbance.  Respiratory: Negative for cough and shortness of breath.   Cardiovascular: Negative for chest pain.  Gastrointestinal: Negative for abdominal pain, nausea and vomiting.  Musculoskeletal: Negative for arthralgias and back pain.  Skin: Negative for color change and rash.  Neurological: Negative for headaches.  All other systems reviewed and are negative.  Physical Exam Updated Vital Signs BP 117/86   Pulse 72   Temp 98.9 F (37.2 C) (Oral)   Resp 16   Wt 61.7 kg (136 lb)   SpO2 100%   BMI 27.01 kg/m   Physical Exam  Constitutional: He appears well-developed and well-nourished.  HENT:  Head: Normocephalic and atraumatic.  Eyes: Pupils are equal, round, and reactive to light. EOM are normal. Right conjunctiva is injected.    No consensual photophobia  Neck: Neck supple.  Cardiovascular: Normal rate and regular rhythm.  No murmur heard. Pulmonary/Chest: Effort normal and breath sounds  normal. No respiratory distress. He has no wheezes. He has no rales.  Abdominal: Soft. There is no tenderness.  Musculoskeletal: He exhibits no edema.  Neurological: He is alert.  Skin: Skin is warm and dry.  Psychiatric: He has a normal mood and affect.  Nursing note and vitals reviewed.    ED Treatments / Results  Labs (all labs ordered are listed, but only abnormal results are displayed) Labs Reviewed  CBC WITH DIFFERENTIAL/PLATELET  BASIC METABOLIC PANEL    EKG  EKG Interpretation None       Radiology Ct Maxillofacial W Contrast  Result Date: 06/04/2017 CLINICAL DATA:  Right eye swelling with pain. EXAM: CT MAXILLOFACIAL WITH CONTRAST TECHNIQUE: Multidetector CT imaging of the maxillofacial structures was performed with intravenous contrast. Multiplanar CT image reconstructions were also generated. CONTRAST:  75mL ISOVUE-300 IOPAMIDOL (ISOVUE-300) INJECTION 61% COMPARISON:  None. FINDINGS: Osseous: --Complex facial fracture types: No LeFort, zygomaticomaxillary complex or nasoorbitoethmoidal fracture. --Simple fracture types: None. --Mandible, hard palate and teeth: No acute abnormality. Orbits: The globes appear intact. Normal appearance of the intra- and extraconal fat. Symmetric extraocular muscles. Sinuses: No fluid levels or advanced mucosal thickening. Soft tissues: Normal visualized extracranial soft tissues. Limited intracranial: Normal. IMPRESSION: Normal facial CT without evidence of orbital or periorbital cellulitis. Electronically Signed   By: Deatra Robinson M.D.   On: 06/04/2017 18:18    Procedures Procedures (including critical care time)  Medications Ordered in ED Medications  acetaminophen (TYLENOL) tablet 650 mg (650 mg Oral Not Given 06/04/17 2319)  iopamidol (ISOVUE-300) 61 % injection 75 mL (75 mLs Intravenous Contrast Given 06/04/17 1750)     Initial Impression / Assessment and Plan / ED Course  I have reviewed the triage vital signs and the nursing  notes.  Pertinent labs & imaging results that were available during my care of the patient were reviewed by me and considered in my medical decision making (see chart for details).     Patient is a 42 year old male with history of anxiety, hyperlipidemia who presents for evaluation of a eyelid abscess.  Patient is being treated with Bactrim and Keflex outpatient since Tuesday.  Patient was told to come to the emergency department since he is having worsening redness and pain.  His ophthalmologist wanted him to receive a CT scan to rule out orbital cellulitis.  Patient seen through first look and the CT is ordered.  He had a normal CBC and BMP without any leukocytosis.  CT did not show any signs of orbital or periorbital cellulitis.  Patient has what appears to be a stye or small left lower lid abscess which is currently draining spontaneously.  Pus is noted to drain with gentle pressure.  He has normal extraocular movements.  No consensual photophobia to suggest iritis.  He has mild conjunctival injection on the right but otherwise no acute findings on exam.  I tried calling his ophthalmologist personal  number that was provided to me by the patient, however she did not answer so I left a voicemail for her to call me back.  Patient has a follow-up appointment tomorrow morning at 9 AM with his ophthalmologist for incision and drainage.  I told the patient to continue taking his antibiotics and to continue warm compresses that seems to be helping at drain.  Patient is discharged in good condition.  Final Clinical Impressions(s) / ED Diagnoses   Final diagnoses:  Eyelid abscess, right    ED Discharge Orders    None       Dwana MelenaDong, Neizan Debruhl, DO 06/05/17 0001    Long, Arlyss RepressJoshua G, MD 06/05/17 [phone]

## 2017-06-04 NOTE — ED Provider Notes (Signed)
Patient placed in Quick Look pathway, seen and evaluated   Chief Complaint: periorbital abscess, pain with EOM, photophobia  HPI:   Patient with no pertinent past medical history presenting with right periorbital abscess.  He is being followed by ophthalmology who has prescribed keflex and bactrim 2 days ago. Patient reports worsening and was sent to the ED for Eval. Denies visual changes. Reports relief from tylenol.  ROS: reports associated right sided head ache and photophobia. Denies fever, chills.  Physical Exam:   Gen: No distress  Neuro: Awake and Alert  Skin: Warm    Focused Exam: inflammation of right lower lid with erythema and purulent center, pupils are round and reactive, full EOM with reported discomfort deep in orbit.  Initiation of care has begun. The patient has been counseled on the process, plan, and necessity for staying for the completion/evaluation, and the remainder of the medical screening examination    Gregary CromerMitchell, Jolyn Deshmukh B, PA-C 06/04/17 1547    Gerhard MunchLockwood, Robert, MD 06/04/17 [phone]

## 2018-06-26 IMAGING — CT CT MAXILLOFACIAL W/ CM
3 of 6 series · 15 of 47 positions shown, 18 images · IV contrast (APPLIED)
Comparison: None.

CLINICAL DATA: Right eye swelling with pain.

EXAM:
CT MAXILLOFACIAL WITH CONTRAST
TECHNIQUE: Multidetector CT imaging of the maxillofacial structures was
performed with intravenous contrast. Multiplanar CT image
reconstructions were also generated.
CONTRAST:  75mL UKSHIS-266 IOPAMIDOL (UKSHIS-266) INJECTION 61%

[Series 3: maxilllofacial 2.0 hr40 3 · axial · 0.34mm/px · z∈[+1232,+1368]mm · 10 of 80 slices shown, 13 images]
[im 6/80  brain]
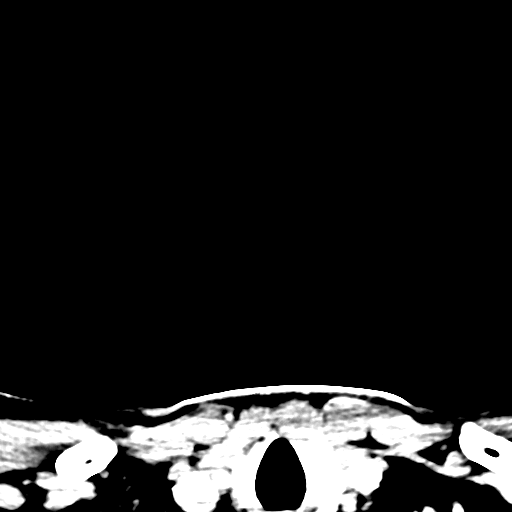
[im 6/80  bone]
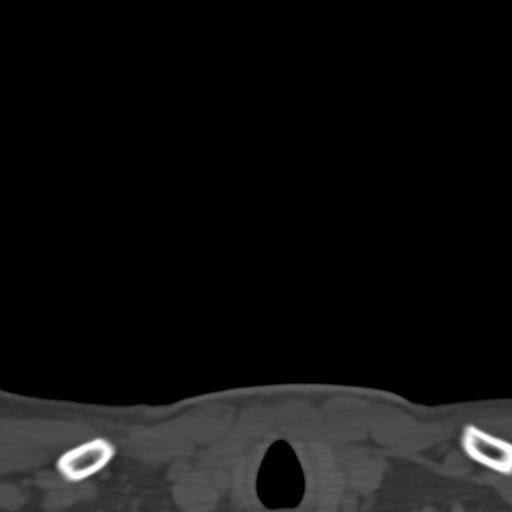
[im 12/80  bone]
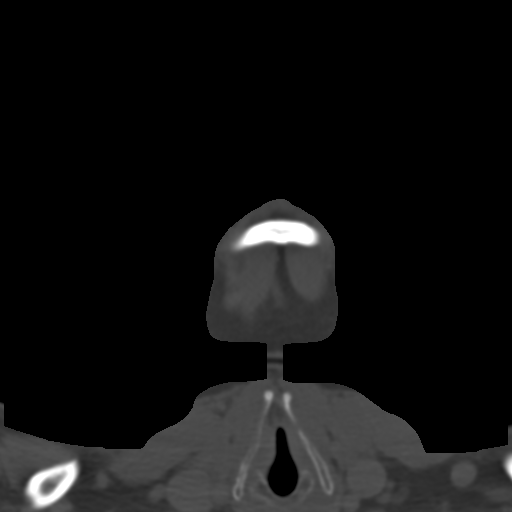
[im 23/80  bone]
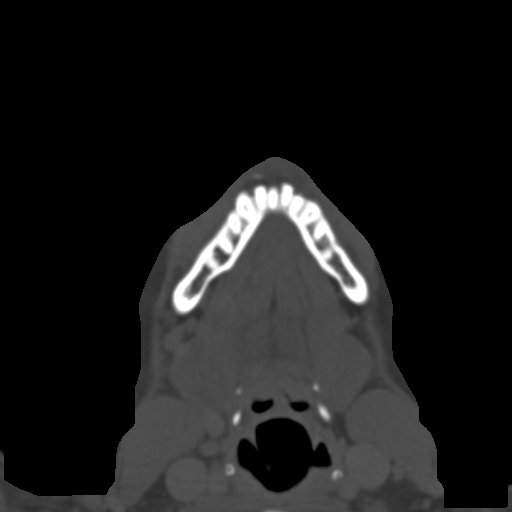
[im 29/80  bone]
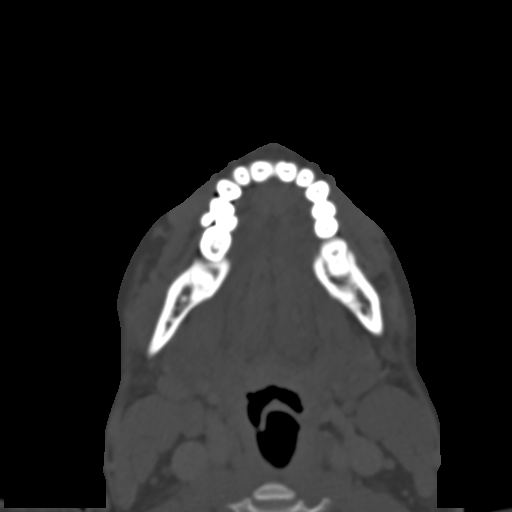
[im 34/80  brain]
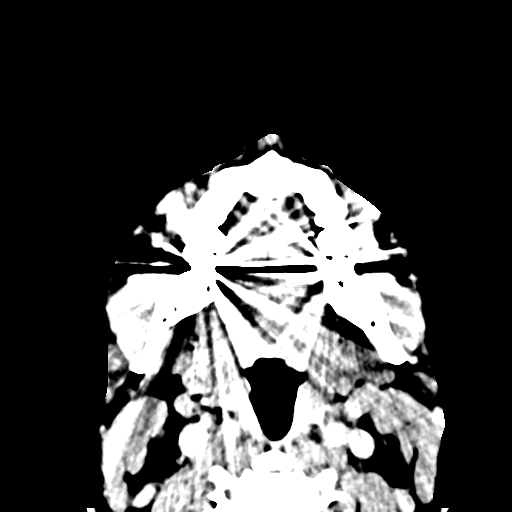
[im 34/80  bone]
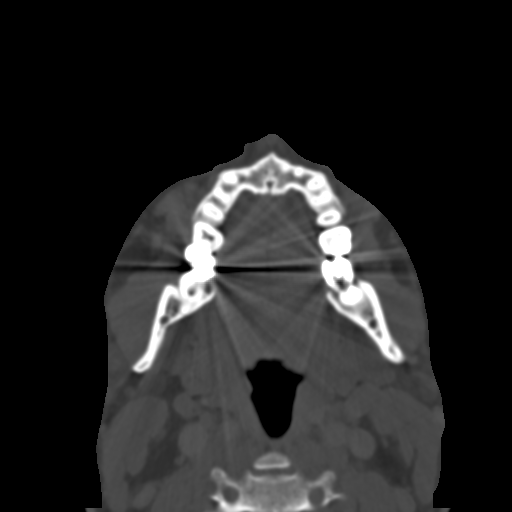
[im 46/80  bone]
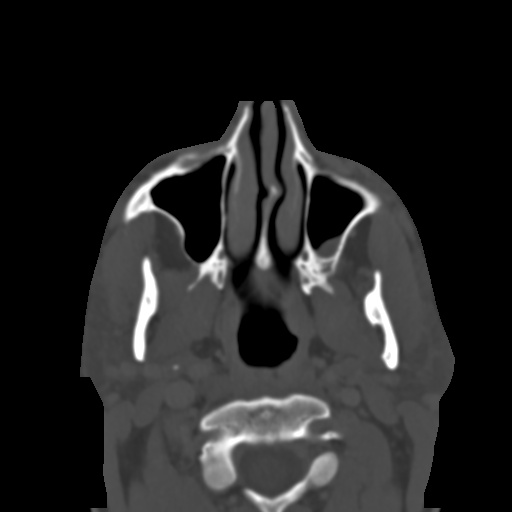
[im 51/80  bone]
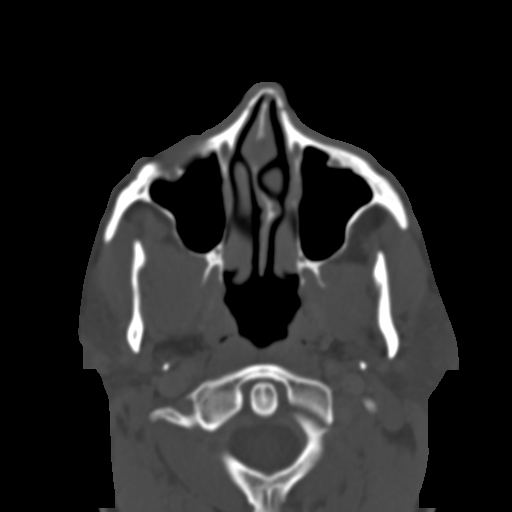
[im 57/80  bone]
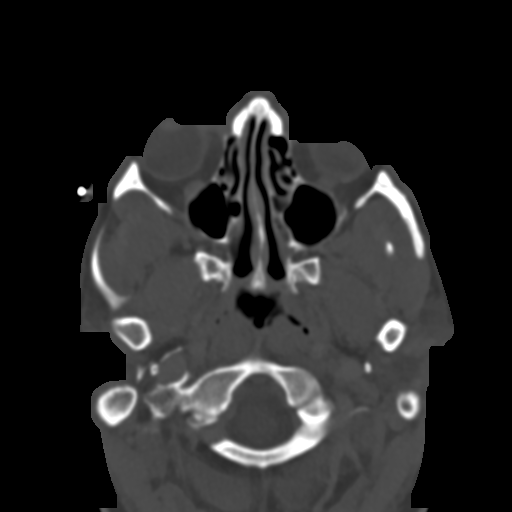
[im 68/80  brain]
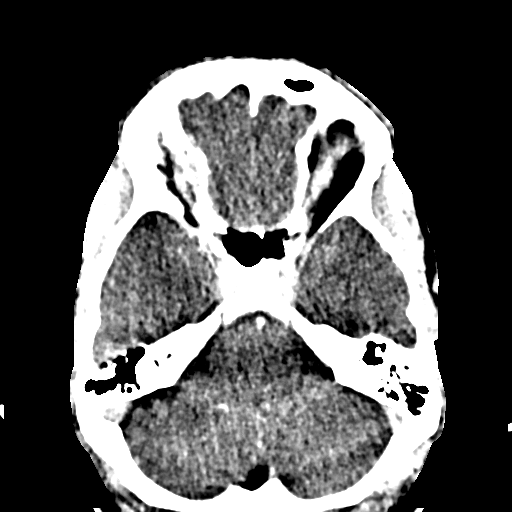
[im 68/80  bone]
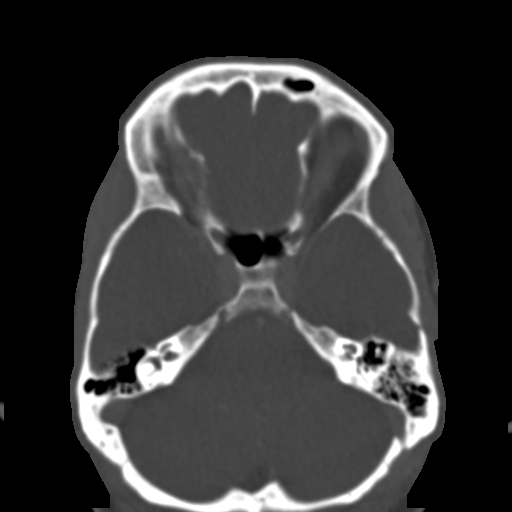
[im 74/80  bone]
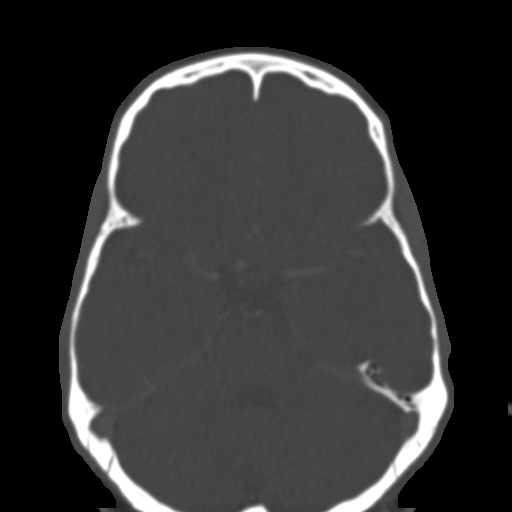

[Series 9: bone cor · coronal · 0.31mm/px · 3 of 76 slices shown]
[im 19/76  bone]
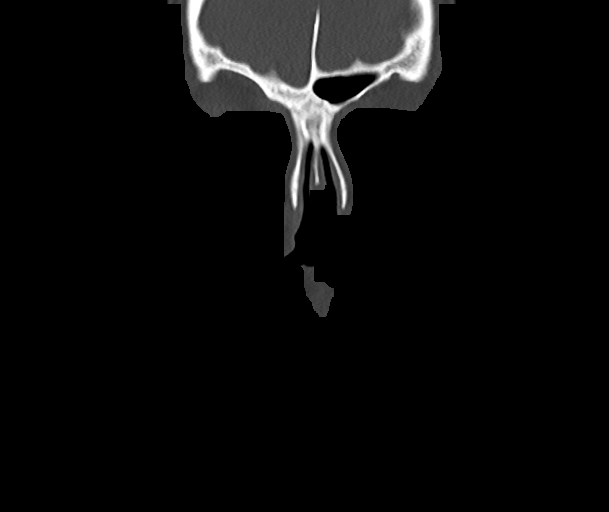
[im 38/76  bone]
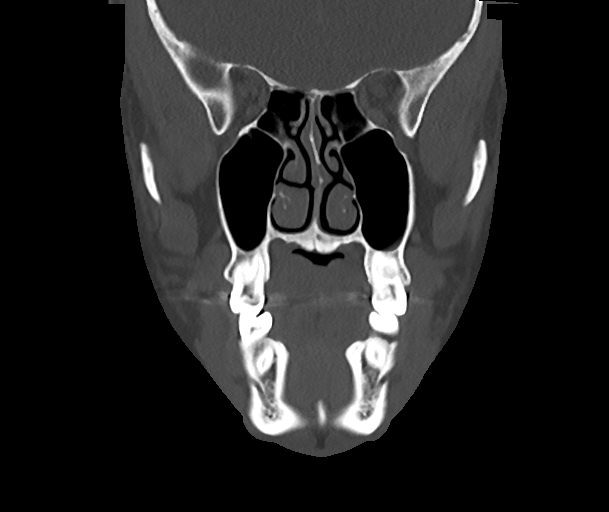
[im 57/76  bone]
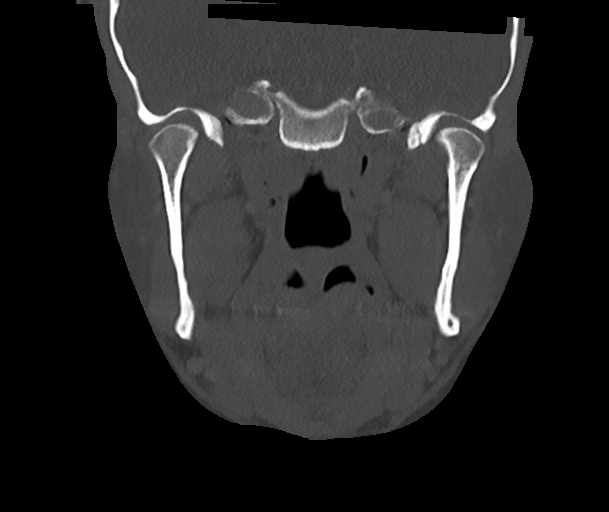

[Series 10: bone sag · sagittal · 0.31mm/px · 2 of 79 slices shown]
[im 27/79  bone]
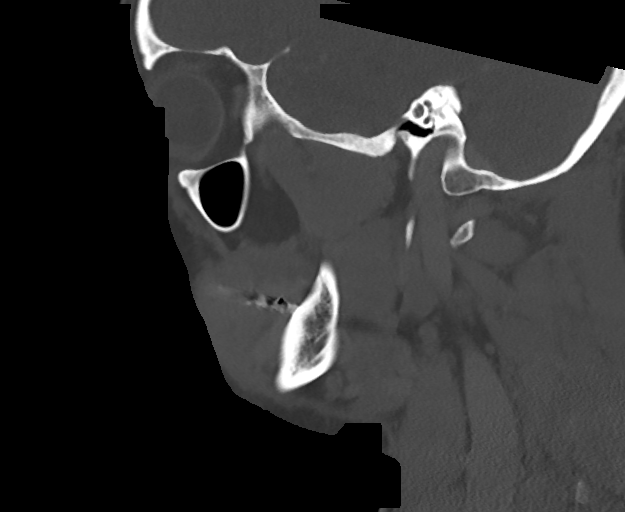
[im 53/79  bone]
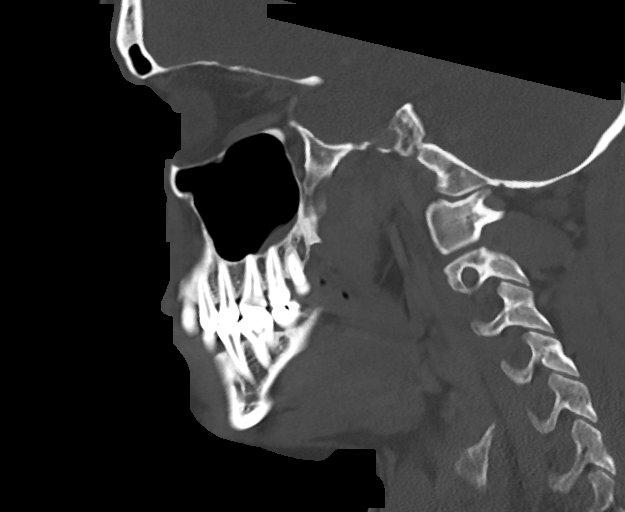

[15 of 47 positions shown; findings below may reference images not displayed]

FINDINGS: Osseous:

--Complex facial fracture types: No LeFort, zygomaticomaxillary
complex or nasoorbitoethmoidal fracture.

--Simple fracture types: None.

--Mandible, hard palate and teeth: No acute abnormality.

Orbits: The globes appear intact. Normal appearance of the intra-
and extraconal fat. Symmetric extraocular muscles.

Sinuses: No fluid levels or advanced mucosal thickening.

Soft tissues: Normal visualized extracranial soft tissues.

Limited intracranial: Normal.
IMPRESSION: Normal facial CT without evidence of orbital or periorbital
cellulitis.

## 2018-08-15 ENCOUNTER — Other Ambulatory Visit: Payer: Self-pay

## 2018-08-15 ENCOUNTER — Encounter (HOSPITAL_COMMUNITY): Payer: Self-pay | Admitting: *Deleted

## 2018-08-15 ENCOUNTER — Emergency Department (HOSPITAL_COMMUNITY)
Admission: EM | Admit: 2018-08-15 | Discharge: 2018-08-15 | Disposition: A | Discharge status: Home / Self Care | Payer: Self-pay | Attending: Emergency Medicine | Admitting: Emergency Medicine

## 2018-08-15 DIAGNOSIS — M5440 Lumbago with sciatica, unspecified side: Secondary | ICD-10-CM | POA: Insufficient documentation

## 2018-08-15 DIAGNOSIS — J45909 Unspecified asthma, uncomplicated: Secondary | ICD-10-CM | POA: Insufficient documentation

## 2018-08-15 DIAGNOSIS — Z79899 Other long term (current) drug therapy: Secondary | ICD-10-CM | POA: Insufficient documentation

## 2018-08-15 DIAGNOSIS — Z87891 Personal history of nicotine dependence: Secondary | ICD-10-CM | POA: Insufficient documentation

## 2018-08-15 MED ORDER — METHOCARBAMOL 500 MG PO TABS: 500.0000 mg | ORAL_TABLET | Freq: Two times a day (BID) | ORAL | 0 refills | Status: AC

## 2018-08-15 MED ORDER — METHOCARBAMOL 500 MG PO TABS: 500.0000 mg | ORAL_TABLET | Freq: Two times a day (BID) | ORAL | 0 refills | Status: DC

## 2018-08-15 NOTE — ED Provider Notes (Signed)
[facility]-EMERGENCY DEPT Provider Note   CSN: [id number] Arrival date & time: 08/15/18  1219    History   Chief Complaint Chief Complaint  Patient presents with  . Back Pain    HPI Daniel Cortez is a 43 y.o. male.     43 year old male presents with several days of right-sided back pain characterizes sharp and worse with movement.  Denies any urinary symptoms.  No hematuria.  No rashes.  Pain has been temporary relieved with Tylenol and Motrin.  Better with remaining still.  States symptoms began after he bent over to pick up something.  Denies any foot drop but does note some pain going down his right leg.     Past Medical History:  Diagnosis Date  . Anxiety   . Daytime sleepiness   . Hypercholesteremia     Patient Active Problem List   Diagnosis Date Noted  . Mild persistent asthma without complication 09/26/2016  . Hypersomnia 05/16/2016  . Exertional dyspnea 05/16/2016    Past Surgical History:  Procedure Laterality Date  . HERNIA REPAIR    . MYRINGOTOMY WITH TUBE PLACEMENT          Home Medications    Prior to Admission medications   Medication Sig Start Date End Date Taking? Authorizing Provider  acetaminophen (TYLENOL CHILDRENS) 160 MG/5ML suspension Take 160 mg by mouth every 6 (six) hours as needed for moderate pain.    [provider]  albuterol (PROVENTIL) (2.5 MG/3ML) 0.083% nebulizer solution Take 2.5 mg by nebulization every 6 (six) hours as needed for wheezing or shortness of breath.    [provider]  fluticasone furoate-vilanterol (BREO ELLIPTA) 100-25 MCG/INH AEPB Inhale 1 puff into the lungs daily. 10/01/16   Parrett, Tammy S, NP  fluticasone furoate-vilanterol (BREO ELLIPTA) 100-25 MCG/INH AEPB Inhale 1 puff into the lungs daily. 10/01/16   Parrett, Virgel Bouquet, NP  ibuprofen (ADVIL,MOTRIN) 800 MG tablet Take 1 tablet (800 mg total) by mouth every 8 (eight) hours as needed. 06/27/16   Long, Arlyss Repress, MD     Family History Family History  Problem Relation Age of Onset  . Diabetes Father   . Seizures Father   . Colon cancer Maternal Grandmother     Social History Social History   Tobacco Use  . Smoking status: Former Smoker    Types: Cigarettes    Last attempt to quit: 03/17/1996    Years since quitting: 22.4  . Smokeless tobacco: Current User  Substance Use Topics  . Alcohol use: Yes  . Drug use: Never     Allergies   Caffeine   Review of Systems Review of Systems  All other systems reviewed and are negative.    Physical Exam Updated Vital Signs BP (!) 124/92 (BP Location: Right Arm)   Pulse 78   Temp 98.7 F (37.1 C) (Oral)   Resp 18   Ht 1.511 m (4' 11.5")   Wt 59 kg   SpO2 99%   BMI 25.82 kg/m   Physical Exam Vitals signs and nursing note reviewed.  Constitutional:      General: He is not in acute distress.    Appearance: Normal appearance. He is well-developed. He is not toxic-appearing.  HENT:     Head: Normocephalic and atraumatic.  Eyes:     General: Lids are normal.     Conjunctiva/sclera: Conjunctivae normal.     Pupils: Pupils are equal, round, and reactive to light.  Neck:     Musculoskeletal:  Normal range of motion and neck supple.     Thyroid: No thyroid mass.     Trachea: No tracheal deviation.  Cardiovascular:     Rate and Rhythm: Normal rate and regular rhythm.     Heart sounds: Normal heart sounds. No murmur. No gallop.   Pulmonary:     Effort: Pulmonary effort is normal. No respiratory distress.     Breath sounds: Normal breath sounds. No stridor. No decreased breath sounds, wheezing, rhonchi or rales.  Abdominal:     General: Bowel sounds are normal. There is no distension.     Palpations: Abdomen is soft.     Tenderness: There is no abdominal tenderness. There is no rebound.  Musculoskeletal: Normal range of motion.     Lumbar back: He exhibits tenderness. He exhibits no bony tenderness.       Back:  Skin:    General: Skin  is warm and dry.     Findings: No abrasion or rash.  Neurological:     Mental Status: He is alert and oriented to person, place, and time.     GCS: GCS eye subscore is 4. GCS verbal subscore is 5. GCS motor subscore is 6.     Cranial Nerves: No cranial nerve deficit.     Sensory: No sensory deficit.     Gait: Gait normal.     Deep Tendon Reflexes:     Reflex Scores:      Patellar reflexes are 2+ on the right side and 2+ on the left side. Psychiatric:        Speech: Speech normal.        Behavior: Behavior normal.      ED Treatments / Results  Labs (all labs ordered are listed, but only abnormal results are displayed) Labs Reviewed - No data to display  EKG None  Radiology No results found.  Procedures Procedures (including critical care time)  Medications Ordered in ED Medications - No data to display   Initial Impression / Assessment and Plan / ED Course  I have reviewed the triage vital signs and the nursing notes.  Pertinent labs & imaging results that were available during my care of the patient were reviewed by me and considered in my medical decision making (see chart for details).        Patient likely MSK back pain and will treat with muscle relaxants.  No red flags for cauda equina or compressive syndrome. Final Clinical Impressions(s) / ED Diagnoses   Final diagnoses:  None    ED Discharge Orders    None       Lorre NickAllen, Dashanique Brownstein, MD 08/15/18 1328

## 2018-08-15 NOTE — ED Triage Notes (Signed)
Pt states he was putting trash out Wed morning, turned wrong and has pain in lower back going down rt leg.
